# Patient Record
Sex: Male | Born: 1975 | Race: White | Hispanic: No | Marital: Married
Health system: Southern US, Community
[De-identification: ages and names within clinical notes are randomized; demographics above are authoritative.]

---

## 2000-01-26 ENCOUNTER — Ambulatory Visit (HOSPITAL_COMMUNITY): Admission: RE | Admit: 2000-01-26 | Discharge: 2000-01-26 | Payer: Self-pay | Admitting: Neurological Surgery

## 2000-01-26 ENCOUNTER — Encounter: Payer: Self-pay | Admitting: Neurological Surgery

## 2000-09-14 ENCOUNTER — Encounter: Payer: Self-pay | Admitting: Family Medicine

## 2000-09-14 ENCOUNTER — Ambulatory Visit (HOSPITAL_COMMUNITY): Admission: RE | Admit: 2000-09-14 | Discharge: 2000-09-14 | Payer: Self-pay | Admitting: Family Medicine

## 2000-10-26 ENCOUNTER — Encounter: Payer: Self-pay | Admitting: Emergency Medicine

## 2000-10-26 ENCOUNTER — Emergency Department (HOSPITAL_COMMUNITY): Admission: EM | Admit: 2000-10-26 | Discharge: 2000-10-26 | Payer: Self-pay | Admitting: Emergency Medicine

## 2001-02-19 ENCOUNTER — Emergency Department (HOSPITAL_COMMUNITY): Admission: EM | Admit: 2001-02-19 | Discharge: 2001-02-19 | Payer: Self-pay | Admitting: Emergency Medicine

## 2001-03-07 ENCOUNTER — Emergency Department (HOSPITAL_COMMUNITY): Admission: EM | Admit: 2001-03-07 | Discharge: 2001-03-07 | Payer: Self-pay | Admitting: Emergency Medicine

## 2001-03-09 ENCOUNTER — Emergency Department (HOSPITAL_COMMUNITY): Admission: EM | Admit: 2001-03-09 | Discharge: 2001-03-09 | Payer: Self-pay | Admitting: Emergency Medicine

## 2002-03-07 ENCOUNTER — Emergency Department (HOSPITAL_COMMUNITY): Admission: EM | Admit: 2002-03-07 | Discharge: 2002-03-07 | Payer: Self-pay | Admitting: Emergency Medicine

## 2002-04-04 ENCOUNTER — Emergency Department (HOSPITAL_COMMUNITY): Admission: EM | Admit: 2002-04-04 | Discharge: 2002-04-04 | Payer: Self-pay | Admitting: Emergency Medicine

## 2002-04-04 ENCOUNTER — Encounter: Payer: Self-pay | Admitting: Emergency Medicine

## 2004-12-06 ENCOUNTER — Emergency Department (HOSPITAL_COMMUNITY): Admission: EM | Admit: 2004-12-06 | Discharge: 2004-12-06 | Payer: Self-pay | Admitting: Emergency Medicine

## 2006-05-14 ENCOUNTER — Inpatient Hospital Stay (HOSPITAL_COMMUNITY): Admission: EM | Admit: 2006-05-14 | Discharge: 2006-05-18 | Payer: Self-pay | Admitting: Emergency Medicine

## 2006-05-14 ENCOUNTER — Ambulatory Visit: Payer: Self-pay | Admitting: Internal Medicine

## 2006-11-10 ENCOUNTER — Emergency Department (HOSPITAL_COMMUNITY): Admission: EM | Admit: 2006-11-10 | Discharge: 2006-11-10 | Payer: Self-pay | Admitting: Emergency Medicine

## 2006-11-25 ENCOUNTER — Emergency Department (HOSPITAL_COMMUNITY): Admission: EM | Admit: 2006-11-25 | Discharge: 2006-11-25 | Payer: Self-pay | Admitting: Emergency Medicine

## 2006-12-20 ENCOUNTER — Inpatient Hospital Stay (HOSPITAL_COMMUNITY): Admission: EM | Admit: 2006-12-20 | Discharge: 2006-12-23 | Payer: Self-pay | Admitting: Emergency Medicine

## 2007-01-07 ENCOUNTER — Ambulatory Visit (HOSPITAL_COMMUNITY): Admission: RE | Admit: 2007-01-07 | Discharge: 2007-01-07 | Payer: Self-pay | Admitting: Neurological Surgery

## 2008-05-29 ENCOUNTER — Emergency Department (HOSPITAL_COMMUNITY): Admission: EM | Admit: 2008-05-29 | Discharge: 2008-05-29 | Payer: Self-pay | Admitting: Emergency Medicine

## 2010-01-30 ENCOUNTER — Emergency Department (HOSPITAL_COMMUNITY): Admission: EM | Admit: 2010-01-30 | Discharge: 2010-01-31 | Payer: Self-pay | Admitting: Emergency Medicine

## 2010-02-07 ENCOUNTER — Ambulatory Visit (HOSPITAL_COMMUNITY): Admission: RE | Admit: 2010-02-07 | Discharge: 2010-02-07 | Payer: Self-pay | Admitting: Urology

## 2010-04-19 ENCOUNTER — Emergency Department (HOSPITAL_COMMUNITY): Admission: EM | Admit: 2010-04-19 | Discharge: 2010-04-19 | Payer: Self-pay | Admitting: Emergency Medicine

## 2011-02-17 LAB — URINALYSIS, ROUTINE W REFLEX MICROSCOPIC
Bilirubin Urine: NEGATIVE
Nitrite: NEGATIVE
Protein, ur: NEGATIVE mg/dL
pH: 5.5 (ref 5.0–8.0)

## 2011-02-17 LAB — POCT I-STAT, CHEM 8
BUN: 18 mg/dL (ref 6–23)
Chloride: 104 mEq/L (ref 96–112)
Glucose, Bld: 240 mg/dL — ABNORMAL HIGH (ref 70–99)
HCT: 43 % (ref 39.0–52.0)
Potassium: 4 mEq/L (ref 3.5–5.1)

## 2011-02-17 LAB — URINE MICROSCOPIC-ADD ON

## 2011-02-24 LAB — URINALYSIS, ROUTINE W REFLEX MICROSCOPIC
Bilirubin Urine: NEGATIVE
Leukocytes, UA: NEGATIVE
Nitrite: NEGATIVE
Specific Gravity, Urine: 1.031 — ABNORMAL HIGH (ref 1.005–1.030)
pH: 6 (ref 5.0–8.0)

## 2011-02-24 LAB — POCT I-STAT, CHEM 8
Hemoglobin: 13.9 g/dL (ref 13.0–17.0)
Sodium: 142 mEq/L (ref 135–145)
TCO2: 28 mmol/L (ref 0–100)

## 2011-02-24 LAB — URINE MICROSCOPIC-ADD ON

## 2011-04-18 NOTE — Discharge Summary (Signed)
Caleb Krueger, Caleb Krueger                 ACCOUNT NO.:  [id number]   MEDICAL RECORD NO.:  [id number]          PATIENT TYPE:  INP   LOCATION:  3010                         FACILITY:  [facility]   PHYSICIAN:  Stefani Dama, M.D.  DATE OF BIRTH:  Jun 19, 1976   DATE OF ADMISSION:  12/20/2006  DATE OF DISCHARGE:  12/23/2006                               DISCHARGE SUMMARY   ADMITTING DIAGNOSES:  1. Sinusitis  2. Herniated nucleus pulposus L3-L4 left.  3. Spondylosis L4-L5 left.  4. Left L5 radiculopathy.   DISCHARGE AND FINAL DIAGNOSES:  1. Sinusitis.  2. Sleep apnea  3. Herniated nucleus pulposus L3-4 left.  4. Spondylosis L4-5 left.  5. Left lumbar radiculopathy   CONDITION ON DISCHARGE:  Improving.   HOSPITAL COURSE:  Caleb Krueger is a 35 year old individual who had  difficulty with back pain and weakness in the left foot. He also in the  emergency room complained primarily of headache and stuffiness in his  head, but during the workup in the emergency department, it seemed that  the attention to weakness in the left lower extremity predominated, and  he was evaluated by Dr. Hilda Lias. An MRI was performed in the  emergency department which demonstrated presence of an extruded fragment  of disk at level of L3-L4 on the left.  It was also noted that he had  substantial spondylosis at L4-L5 on that left side with some lateral  recess stenosis affecting of the left L5 nerve root. He was started on  some Augmentin orally for the sinusitis and was given the option to be  admitted, and he was admitted to the hospital for pain control.   I had seen the patient on Tuesday morning on my return and noted that he  had the presence of a large disk herniation with marked foot drop on  that left side. He was taken to the operating room on the evening of  January 22 where he underwent diskectomy at L3-4, laminotomy and  foraminotomy L4-5.   Postoperatively, he has had good relief of the back and  leg pain.  His  incision is clean and reasonably dry at this time. He is ambulating  albeit with a foot drop. He will require some physical therapy during  the postoperative course, and this will be arranged on outpatient basis.  At the current time, he is given some local physical therapy.  He was  given a prescription for Percocet #60 without refills, Valium 5 mg #30  without refills.  He also given a 10-day course of Augmentin 875 mg  b.i.d.Marland Kitchen   The wife had voiced concern that the patient had some sleep apnea, and,  indeed, during the hospitalization was noted that in the evenings he  would desaturate spontaneously to the mid 80s.  The patient's  significant past medical history is that in the last 2 years he has had  kidney stones, a bout of pneumonia, chronic sinusitis, and, of course,  the back and leg problems.  He works as an [employer] manager, and I  discussed with him the implementation of  some  lifestyle changes and an effort to eat better, exercise more regularly,  and take stock of his overall health in an effort to improve his overall  conditioning. At the current time, he is discharged home, recovering  acutely from the surgery, and we will make arrangements for him to be  evaluated for sleep apnea on an outpatient basis.      Stefani Dama, M.D.  Electronically Signed     HJE/MEDQ  D:  12/23/2006  T:  12/23/2006  Job:  [id number]   cc:   Tammy R. Collins [place], M.D.

## 2011-04-18 NOTE — Op Note (Signed)
NAMELATHAM, KINZLER                 ACCOUNT NO.:  [id number]   MEDICAL RECORD NO.:  [id number]          PATIENT TYPE:  INP   LOCATION:  3010                         FACILITY:  [facility]   PHYSICIAN:  Stefani Dama, M.D.  DATE OF BIRTH:  10-09-1976   DATE OF PROCEDURE:  12/22/2006  DATE OF DISCHARGE:                               OPERATIVE REPORT   PREOPERATIVE DIAGNOSIS:  Herniated nucleus pulposus L3-4, left and  spondylosis L4-5, left with left lumbar radiculopathy, L5 distribution.   POSTOPERATIVE DIAGNOSIS:  Herniated nucleus pulposus L3-4, left and  spondylosis L4-5, left with left lumbar radiculopathy, L5 distribution.   PROCEDURES:  Microdiskectomy L3-4, left; laminotomy, foraminotomy L4-5,  left with operating microscope, microdissection technique.   SURGEON:  Stefani Dama, M.D.   FIRST ASSISTANT:  Dr. Coletta Memos.   ANESTHESIA:  General endotracheal.   INDICATIONS:  Caleb Krueger is a 35 year old individual who has had  significant problems with back and left lower extremity pain and now  weakness in the L5 distribution, having developed a foot drop.  He was  evaluated with an MRI on admission and found to have a large extruded  fragment of disk at L3-L4 on the left; however, the patient had a foot  drop consistent with an L5 radiculopathy and it was noted that he had  severe spondylitic stenosis in the lateral recess on the left side at L4-  5.  After careful consideration, he is advised regarding surgical  decompression at L3-4 and L4-5 on the left side.   PROCEDURE:  The patient was brought to the operating room supine on the  stretcher.  After smooth induction of general endotracheal anesthesia,  she was turned prone.  The back was shaved, prepped with alcohol and  DuraPrep and draped in a sterile fashion.  The lower portion of  previously made incision in this area was opened and dissection was  carried down to lumbodorsal fascia which was opened on the left side  of  midline.  The first interspace identified was L4-L5 on localizing  radiograph.  With the soft tissues being cleared in subperiosteal  fashion, laminotomy of L4 was undertaken by removing the inferior margin  lamina of L4 out to the medial wall of facette at the L4-L5 level.  The  thickened and redundant yellow ligament in this area was taken up with 2  and 3 mm Kerrison punch.  High-speed bur was used to do the laminotomy  and mesial facetectomy in this region.  The common dural tube was  ultimately exposed and then the dissection was taken down to identify  the takeoff of the L5 nerve root.  A foraminotomy was created over the  L5 nerve root where the mesial wall of facette was substantially  overgrown causing significant stenosis.  Once this area was  decompressed, attention was turned to L3-4 where laminotomy was created  in a similar fashion.  It should be noted that this portion of the  dissection including the laminotomy and foraminotomy was done under the  operating microscope with Dr. Franky Macho helping retract the nerve root  and  common dural tube while this procedure was being undertaken.  The L3-4  laminotomy and foraminotomy was undertaken in similar fashion and as the  area was opened and nerve root was exposed, it was noted be bowed  dorsally under significant mass by gently retracting the nerve root.  There was found to be fragments of disk material within the disk space.  This was removed in a piecemeal fashion, identifying fragments of disk  and taking them out sequentially.  Epidural bleeding occurred in this  area and this was controlled with bipolar cautery and some small  pledgets of Gelfoam soaked in thrombin.  Ultimately a portion of the  disk that extended cephalad to the disk space was identified and this  was withdrawn from by removing it from above.  The path of the L3 nerve  root above could then be palpated out in the foramen.  The L4 nerve root  itself was  decompressed by the diskectomy.  The disk space itself did  not appear to be violated and no gross opening into the disk space could  be identified.  With the decompression thus being completed, the common  dural tube and the L4 nerve root was well decompressed at the L3-4  space.  L4-5 was again inspected and found to be decompressed with the  L5 nerve root exiting freely.  Area was copiously irrigated with  antibiotic irrigating solution.  Hemostasis in the soft tissues was  obtained and 1 mL of fentanyl was left in the epidural space and the  lumbodorsal fascia was closed with #1 Vicryl in interrupted fashion, 2-0  Vicryl subcutaneous tissues, 3-0 Vicryl subcuticularly.  Then dry  sterile dressing placed on the skin.  The patient tolerated procedure  well, was returned to recovery in stable condition.   BLOOD LOSS:  150 mL.      Stefani Dama, M.D.  Electronically Signed     HJE/MEDQ  D:  12/22/2006  T:  12/23/2006  Job:  [id number]

## 2011-04-18 NOTE — Discharge Summary (Signed)
Caleb Krueger, Caleb Krueger NO.:  [id number]   MEDICAL RECORD NO.:  [id number]          PATIENT TYPE:  INP   LOCATION:  3008                         FACILITY:  [facility]   PHYSICIAN:  Duncan Dull, M.D.     DATE OF BIRTH:  1976-07-17   DATE OF ADMISSION:  05/14/2006  DATE OF DISCHARGE:  05/18/2006                                 DISCHARGE SUMMARY   DISCHARGE DIAGNOSES:  1.  Community acquired pneumonia.  2.  Steroid induced hypoglycemia and leukocytosis.  3.  Ongoing tobacco abuse.   DISCHARGE MEDICATIONS:  1.  Avelox 400 mg p.o. daily until 05/21/2006.  2.  Prednisone on taper, last dose to be taken on 05/25/2006.  3.  Spiriva 18 mcg to be inhaled once daily.  4.  Albuterol MDI 1-2 puffs q. 4-6 hours p.r.n.  5.  Tylenol 650 mg q. 4-6 hours p.r.n. for pain.  6.  Mucinex 600 mg p.o. b.i.d.  7.  Tussionex suspension 5 mL at bedtime.   FOLLOWUP:  The patient is to follow up with his primary care physician, Dr.  Yehuda Budd within one week of discharge.  At that time, a basic metabolic panel  along with a CBC must be checked as the patient has steroid induced  hypoglycemia and a leukocytosis.   Images done this admission:  1.  Chest x-ray done on 05/14/2006 showing bronchitic changes.  No focal      infiltrates.  2.  CT angio of chest done on 05/15/2006 negative for PE with abnormal      ground-glass opacities in perihilar lesions suggestive of atypical      pneumonia, bronchiectasis obliterans, or alveolitis.   CONSULTANTS:  None.   PROCEDURES:  None.   BRIEF HISTORY AND PHYSICAL:  Caleb Krueger is a 36 year old white man with a  history of complicated orthopedic procedure for congenital spine disease  presenting to the emergency department with complaints of chest pain  accompanied by dyspnea.  His symptoms began four days prior to admission  after a prolonged drive from [place] and was described as central in  location, constant, pressure-type, which increased with  coughing.  Patient  had been was treated by by his primary care physician prior to admission  with  Avelox for presumed atypical pneumonia.   ALLERGIES:  NO KNOWN DRUG ALLERGIES.   MEDICATIONS:  1.  Avelox 400 mg p.o. daily.  2.  Tylenol/pain meds p.r.n.  3.  Viagra.  4.  Albuterol p.r.n.  5.  Duramax (decongestant).   SUBSTANCE HISTORY:  See H & P.   SOCIAL HISTORY:  See H & P.   FAMILY HISTORY:  See H & P.   PHYSICAL EXAMINATION:  Temperature 96.5, blood pressure 130/84, pulse 77,  respiratory rate 20, O2 sat 90% on room air.  (Patient's O2 sats apparently  were 88% in Dr. Alda Berthold office.)  GENERAL APPEARANCE:  The patient appeared uncomfortable with labored  breathing.  EYES:  Pupils equal, round, reactive to light.  Extraocular movements  intact.  ENT:  Oropharynx clear.  No erythema or exudate.  NECK:  Supple with no adenopathy.  LUNGS:  Air entry equal bilaterally.  Inspiratory wheezes.  HEART:  Regular rate and rhythm.  No murmurs, rubs, or gallops.  ABDOMEN:  Soft, nondistended.  Tenderness present in the right upper  quadrant and epigastric region.  Bowel sounds present.  EXTREMITIES:  Edema negative.  No calf tenderness.  NEUROLOGIC:  Alert and oriented x3.  Grossly nonfocal.  PSYCHIATRIC:  Appropriate.   LABORATORY DATA:  On examination, sodium 140, potassium 3.9, chloride 108,  bicarb 24, BUN 17, creatinine 0.9, glucose 89.  Total bilirubin of 0.5, alk  phos of 89, SGOT 27, SGPT 29, total protein 7.6, albumin 4.3, calcium 9.  Hemoglobin 16.6, hematocrit 48.5, white cell 9.2, ANC 5.8, and platelets of  194.   BRIEF HOSPITAL COURSE:  1.  Community acquired pneumonia versus atypical pneumonia versus      bronchitis.  The patient was treated with ceftriaxone and azithromycin      intravenously.  Blood and sputum cultures for bacterial pathogens were      negative x2.  Urine for Legionella antigen was negative.  HIV test was      nonreactive.  Sputum cultures for  fungus were collected due to recent      travel to [place] and were pending on discharge.  Patient was      transitioned to oral antiobiotics with continued improvement.  After      The patient was continued on bronchodilator therapy, Tylenol p.r.n. for      chest pain, and antitussive medications at night.  The patient needs to      follow up with his primary care physician within one week of discharge      from the hospital.  Chest x-ray must be done six weeks after discharge      in order to check for resolution of the pneumonia.  If patient's      symptoms continue to persist, antifungals should be considered      especially since the patient has extensive travel history in the last 2-      3 weeks.  2.  Steroid induced leukocytosis and hypoglycemia.  The patient was treated      with methylprednisolone for wheezing.  As steroids were tapered, his      leukocytosis and hypoglycemia improved.  This needs to be monitored as      an outpatient.  Therefore, a stat basic metabolic panel and complete      blood count must be done at his primary care physician's office.  3.  Ongoing tobacco abuse.  Tobacco cessation consult was called and patient      reports planning to quit on his own.   DISCHARGE VITALS:  Temperature 96.8, blood pressure 112/60, pulse 67,  respiratory rate of 18, and O2 sat of 93% on room air.  On ambulation the  patient's O2 sats were 92-95%.   LABORATORY DATA:  On discharge, sodium 139, potassium 3.8, chloride 102,  bicarb 31, BUN 7, creatinine 0.9, glucose of 181.  Hemoglobin of 12,  hematocrit 35.2, white cell 5.3, and platelets of 293.   Of note, the patient performed peak flow three times and achieved a result  of 450.      Yetta Barre, M.D.  Electronically Signed      Duncan Dull, M.D.  Electronically Signed    SS/MEDQ  D:  05/25/2006  T:  05/25/2006  Job:  [id number]

## 2011-04-18 NOTE — H&P (Signed)
Caleb Krueger, OOMMEN NO.:  [id number]   MEDICAL RECORD NO.:  [id number]          PATIENT TYPE:  INP   LOCATION:  1832                         FACILITY:  [facility]   PHYSICIAN:  Hilda Lias, M.D.   DATE OF BIRTH:  Jun 05, 1976   DATE OF ADMISSION:  12/20/2006  DATE OF DISCHARGE:                              HISTORY & PHYSICAL   HISTORY OF PRESENT ILLNESS:  [name] is a gentleman who came to the  emergency room complaining of weakness of the left foot for almost 7  days.  This happened many years ago and he underwent lumbar fusion at  [facility] and 10 years ago he was seen by my partner Dr. Danielle Dess in the  office because of back pain, he was told he had degenerative disc  disease.  He has not done any surgery on him.  Today, he came to the  emergency room with a history of 6 days of weakness of the left foot  with pain.  He told me that the pain is really unbearable, although the  weakness is not getting any worse, although he feels that it is getting  a little bit better for the past few days.  He denies any problems with  bladder or bowels.  He denies any problem with the right leg.   PAST MEDICAL HISTORY:  1. Lumbar fusion x2, at [facility].  2. Surgery of the right foot.   ALLERGIES:  HE IS NOT ALLERGIC TO ANY MEDICATION.   SOCIAL HISTORY:  He does not smoke.   FAMILY HISTORY:  Unremarkable.   REVIEW OF SYSTEMS:  Positive for back pain.   PHYSICAL EXAMINATION:  GENERAL:  Patient was laying on the stretcher  complaining of pain down the left leg.  HEAD:  Normal.  NECK:  Normal.  LUNGS:  Clear.  HEART:  Heart sounds normal.  ABDOMEN:  Normal.  EXTREMITIES:  Normal pulse.  He has a scar on the right foot from  previous surgery.  NEURO:  Mental status normal.  Cranial nerves normal.  His strength is  5/5 except in the left foot.  He has 4/5 right plantar flexion and 2/5  dorsiflexion of the left foot.  He has numbness of the left foot,  __________  is positive at 45 degrees on the left side.   The MRI showed the previous surgery with metal in the area, most likely  Harrington rods.  There is a herniated disc of the level of L5-S1  affecting the S1 nerve root.   IMPRESSION:  Left L5-S1 herniated disc with a 2/5 weakness dorsiflexion  of the left foot.   RECOMMENDATIONS:  I talked to him and his wife at length.  I gave them 2  choices.  One would be to go home with medication including Cortisone by  mouth and to follow up in the next 2 or 3 day to see how he does.  The  reason for that is because according to himself, he is getting a little  better in relation to the weakness.  The other choice was  to be admitted  to the hospital.  Patient and his wife feel that he had so pain that  they prefer for him to be admitted to the hospital for better control.  We are going to admit him to the unit 3000.  He is going get p.o.  Decadron, morphine intravenous, and see how he does in the next 24-48  hours.  If he is not better, I am sure Dr. __________  will be taking  him to surgery.           ______________________________  Hilda Lias, M.D.     EB/MEDQ  D:  12/20/2006  T:  12/20/2006  Job:  [id number]

## 2011-08-28 LAB — DIFFERENTIAL
Basophils Relative: 0
Lymphs Abs: 2.1
Monocytes Relative: 15 — ABNORMAL HIGH
Neutro Abs: 4
Neutrophils Relative %: 54

## 2011-08-28 LAB — CBC
RBC: 4.34
WBC: 7.5

## 2011-08-28 LAB — POCT I-STAT, CHEM 8
BUN: 22
Creatinine, Ser: 1.1
Hemoglobin: 13.9
Potassium: 4
Sodium: 141

## 2012-01-29 IMAGING — CT CT ABD-PELV W/O CM
3 series · 11 of 23 positions shown, 18 images · non-contrast
Comparison: None.

CLINICAL DATA: Bilateral flank and low back pain, worse on the
left.  History urinary tract stones.

CT ABDOMEN AND PELVIS WITHOUT CONTRAST
TECHNIQUE: Multidetector CT imaging of the abdomen and pelvis was
performed following the standard protocol without intravenous
contrast.

[Series 6: lung windows · axial · 0.83mm/px · z∈[+956,+986]mm · 7 of 9 slices shown, 12 images]
[im 2/9  soft-tissue]
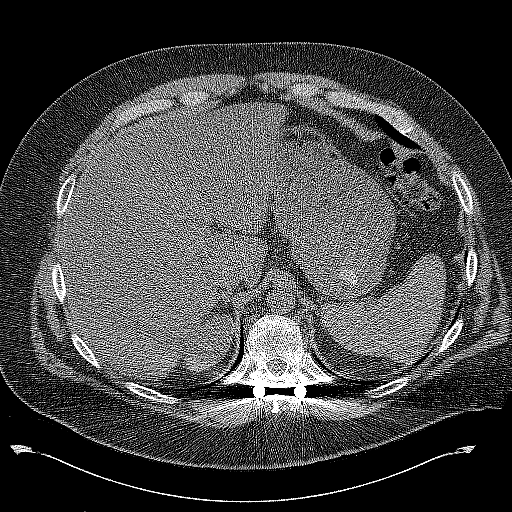
[im 2/9  bone]
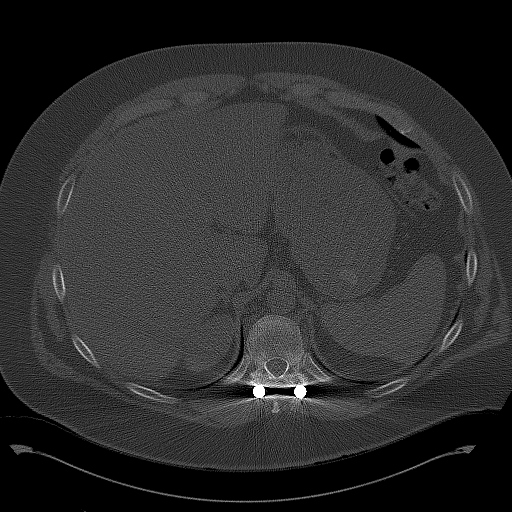
[im 3/9  soft-tissue]
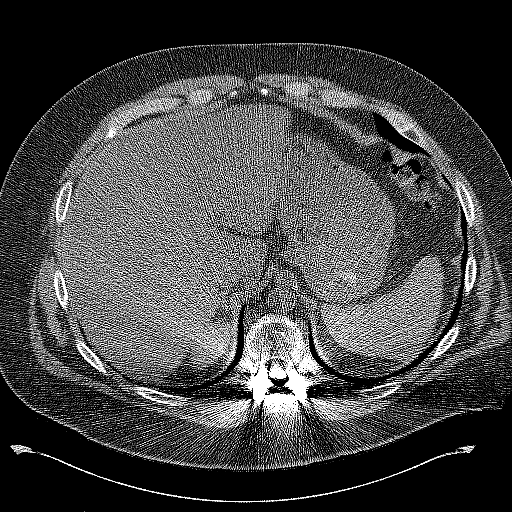
[im 4/9  soft-tissue]
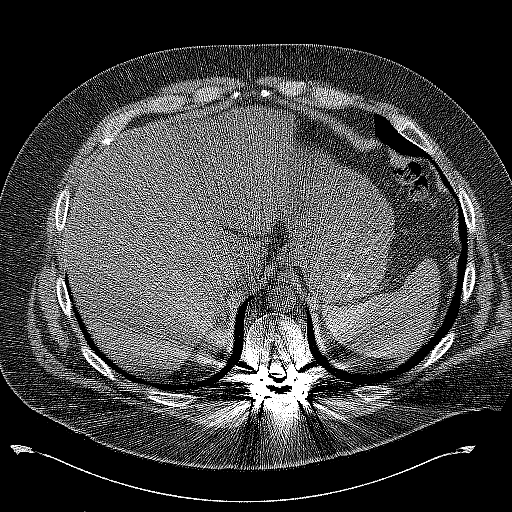
[im 5/9  soft-tissue]
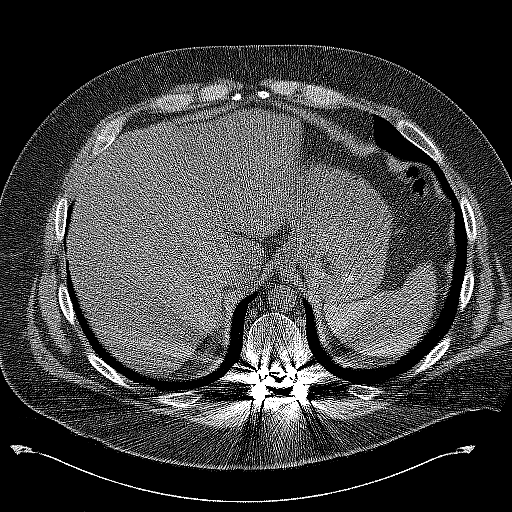
[im 5/9  lung]
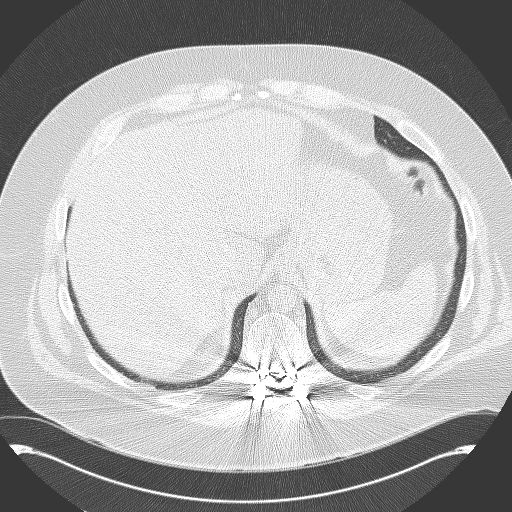
[im 6/9  soft-tissue]
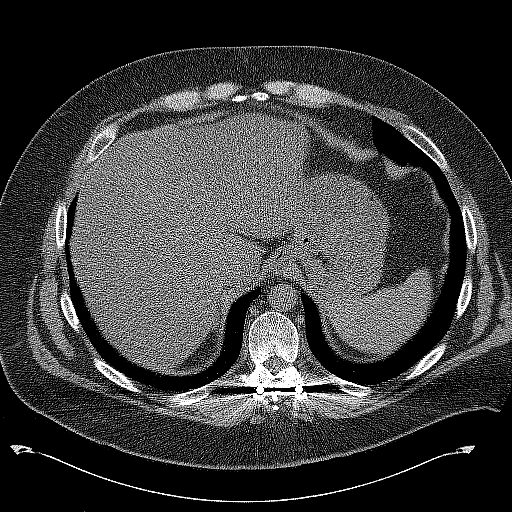
[im 6/9  lung]
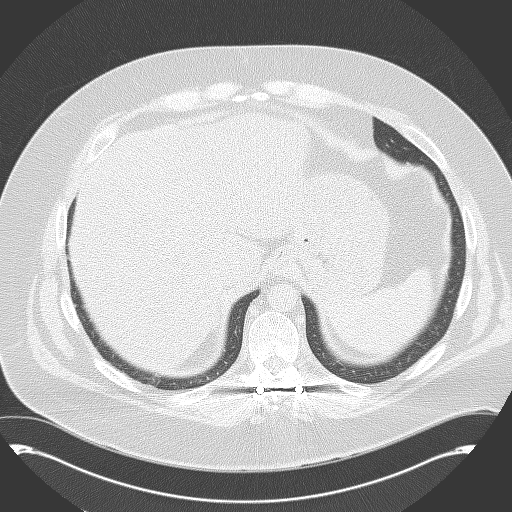
[im 7/9  soft-tissue]
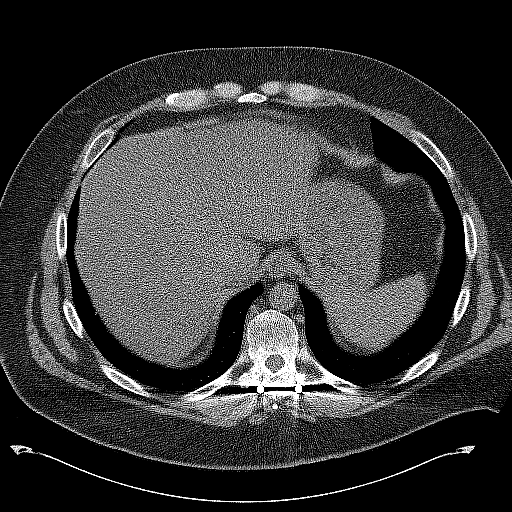
[im 7/9  lung]
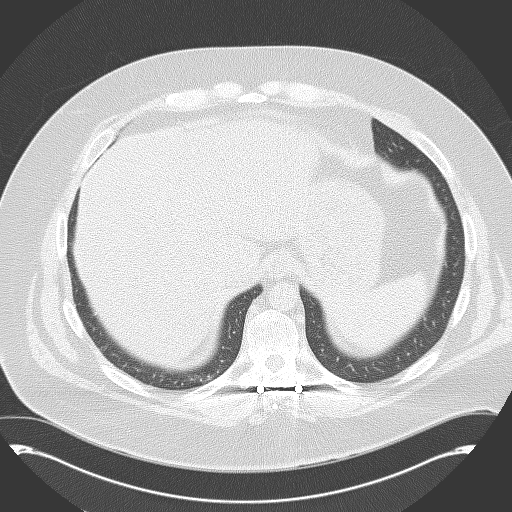
[im 8/9  soft-tissue]
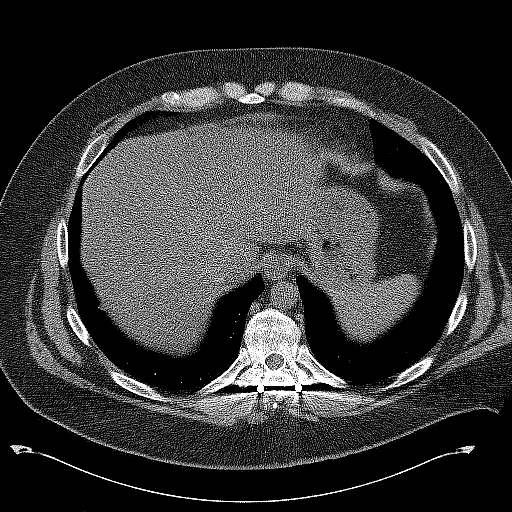
[im 8/9  lung]
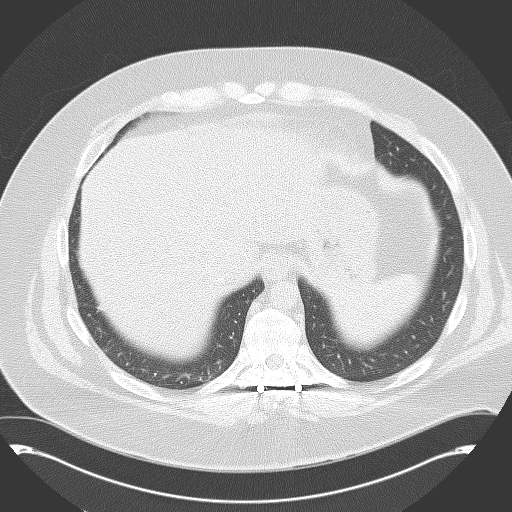

[Series 602: <mpr thick range> · coronal · 0.83mm/px · 3 of 92 slices shown, 4 images]
[im 31/92  soft-tissue]
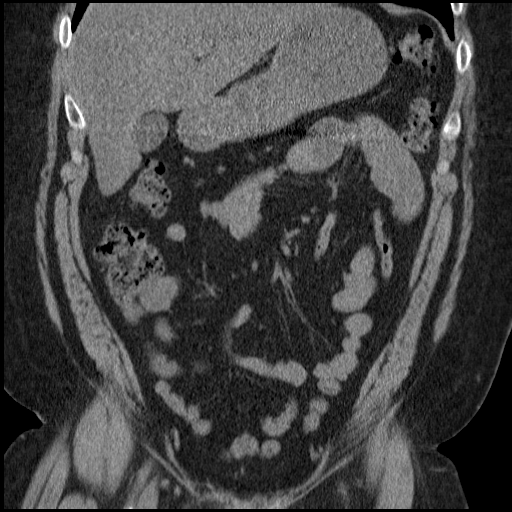
[im 41/92  soft-tissue]
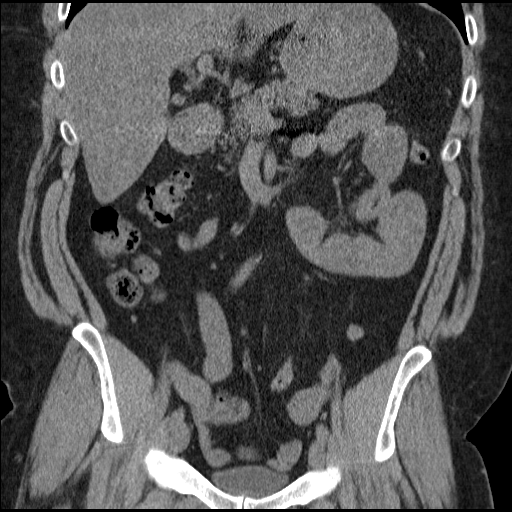
[im 41/92  bone]
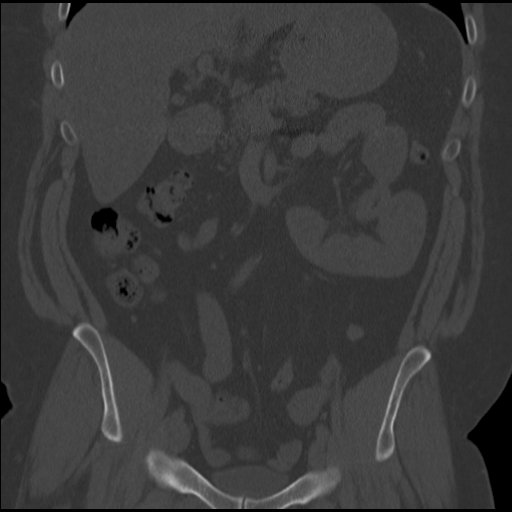
[im 51/92  soft-tissue]
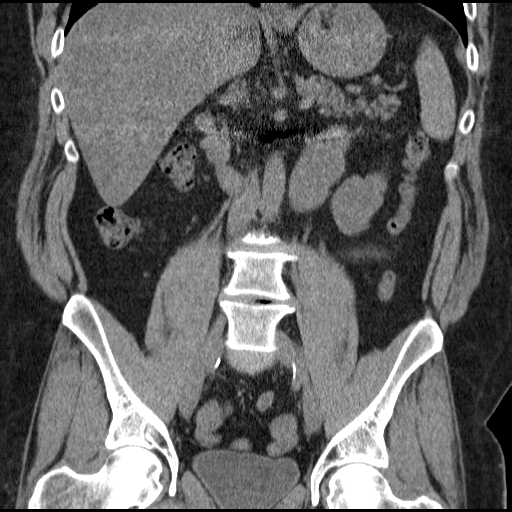

[Series 603: <mpr thick range(1)> · sagittal · 0.83mm/px · 1 of 123 slices shown, 2 images]
[im 41/123  soft-tissue]
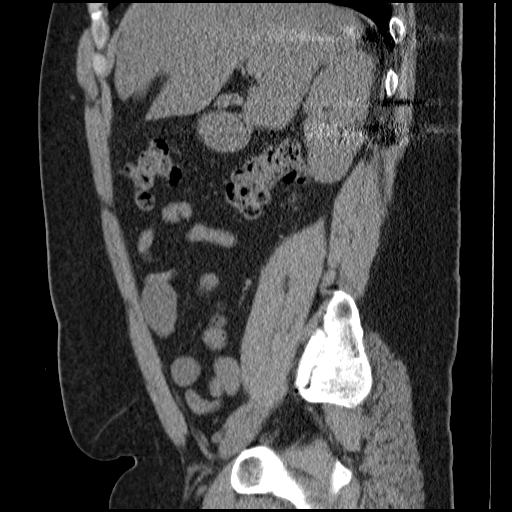
[im 41/123  bone]
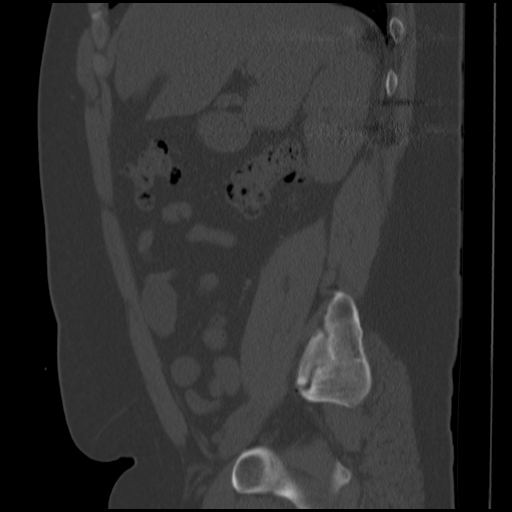

[11 of 23 positions shown; findings below may reference images not displayed]

FINDINGS: Imaged lung parenchyma is clear.  No pleural effusion is
identified.

The patient has a large stone in the left renal pelvis which
measures 1.2 cm in diameter.  There is mild left hydronephrosis.
No other urinary tract stones on the right or left.  The kidneys
otherwise have a normal uninfused appearance.

The liver is diffusely low attenuating consistent with fatty
change.  No focal liver lesion.  The gallbladder, adrenal glands,
spleen and visualized pancreas appear normal.  No lymphadenopathy
or fluid.  The stomach and small large bowel and appendix all
appear normal.  There is no focal bony abnormality.  There is
partial visualization of a spinal fusion hardware extending above
the superior margin of the film.
IMPRESSION: 1.  Large stone in the left renal pelvis with resultant mild left
hydronephrosis.
2.  Fatty infiltration of the liver.

## 2012-04-16 IMAGING — CR DG ABDOMEN 1V
3 series · 3 of 3 positions shown · non-contrast
Comparison: One-view abdomen x-ray 02/07/2010 and CT abdomen and
pelvis 01/31/2010.  AP pelvis x-ray 12/06/2004.

CLINICAL DATA: Low back pain.  History of urinary tract calculi.

ABDOMEN - 1 VIEW 04/19/2010:

[t abdomen supine (1 of 3)]
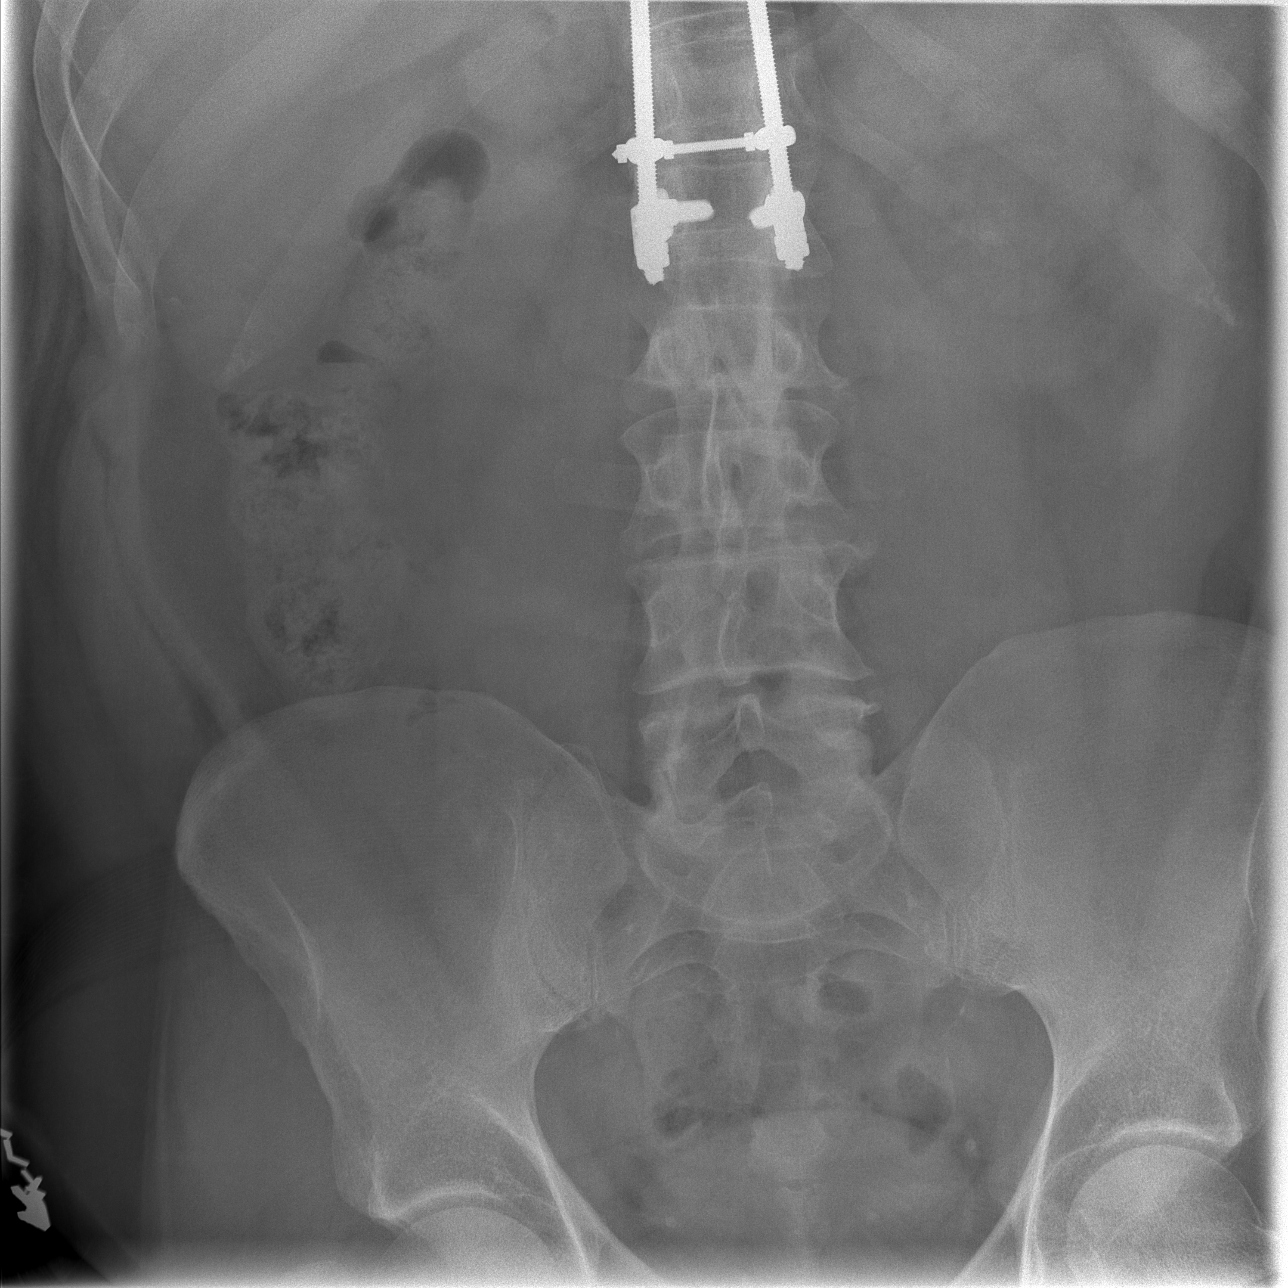

[t abdomen supine (2 of 3)]
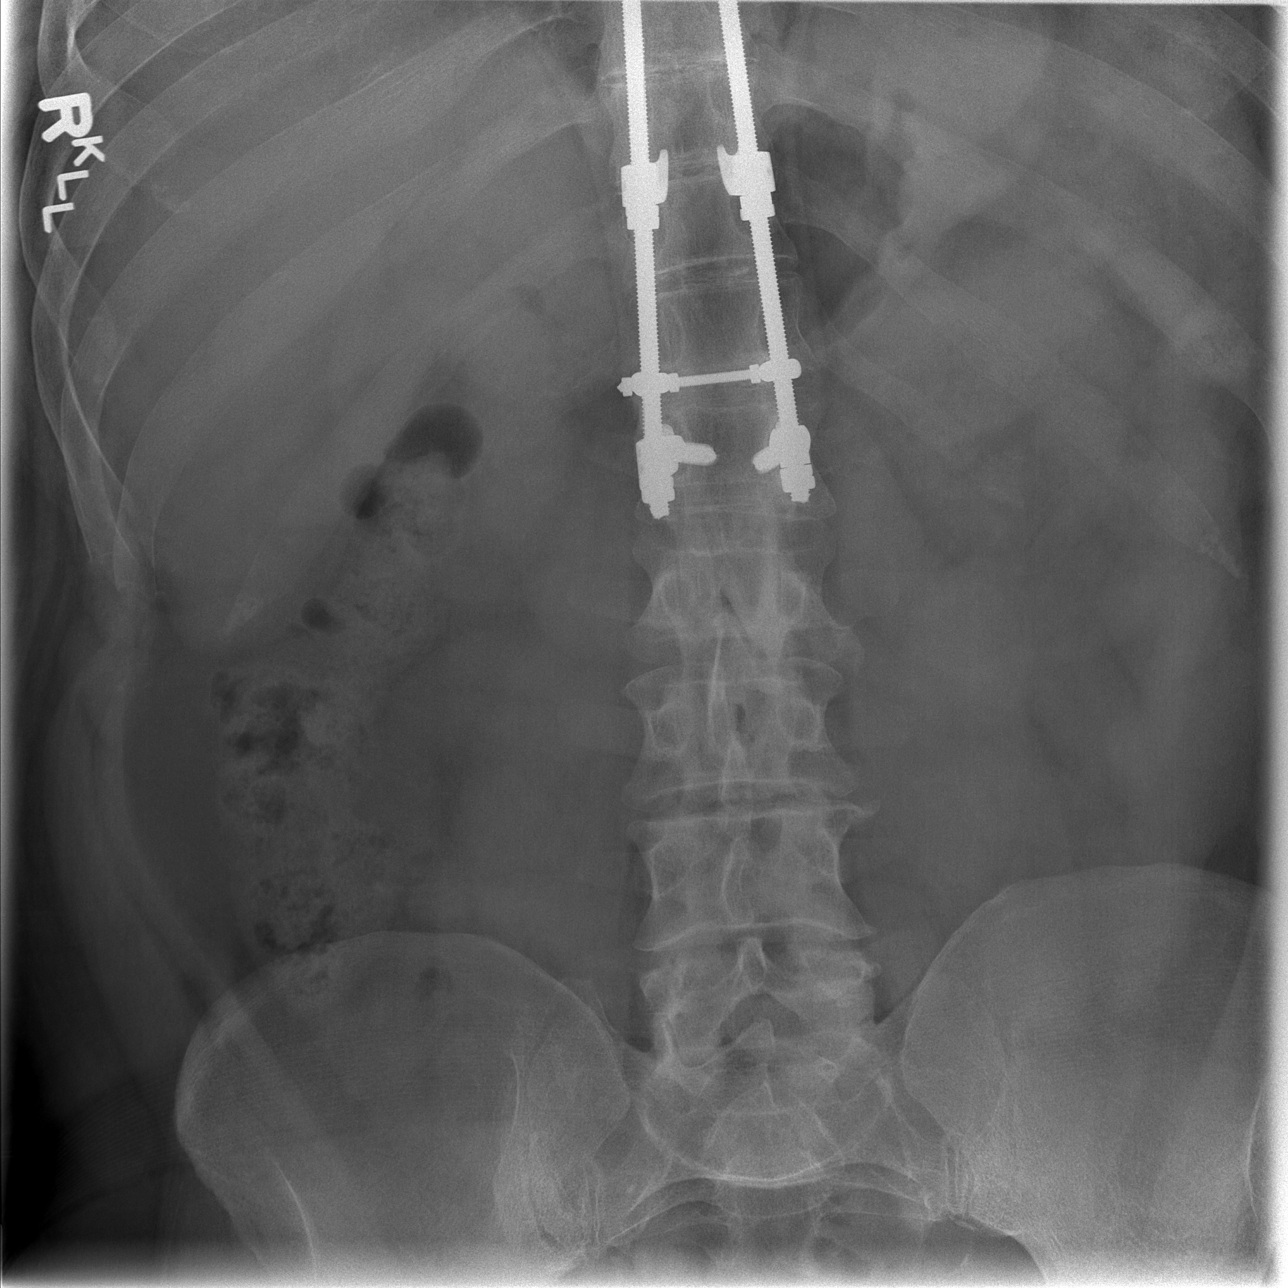

[t abdomen supine (3 of 3)]
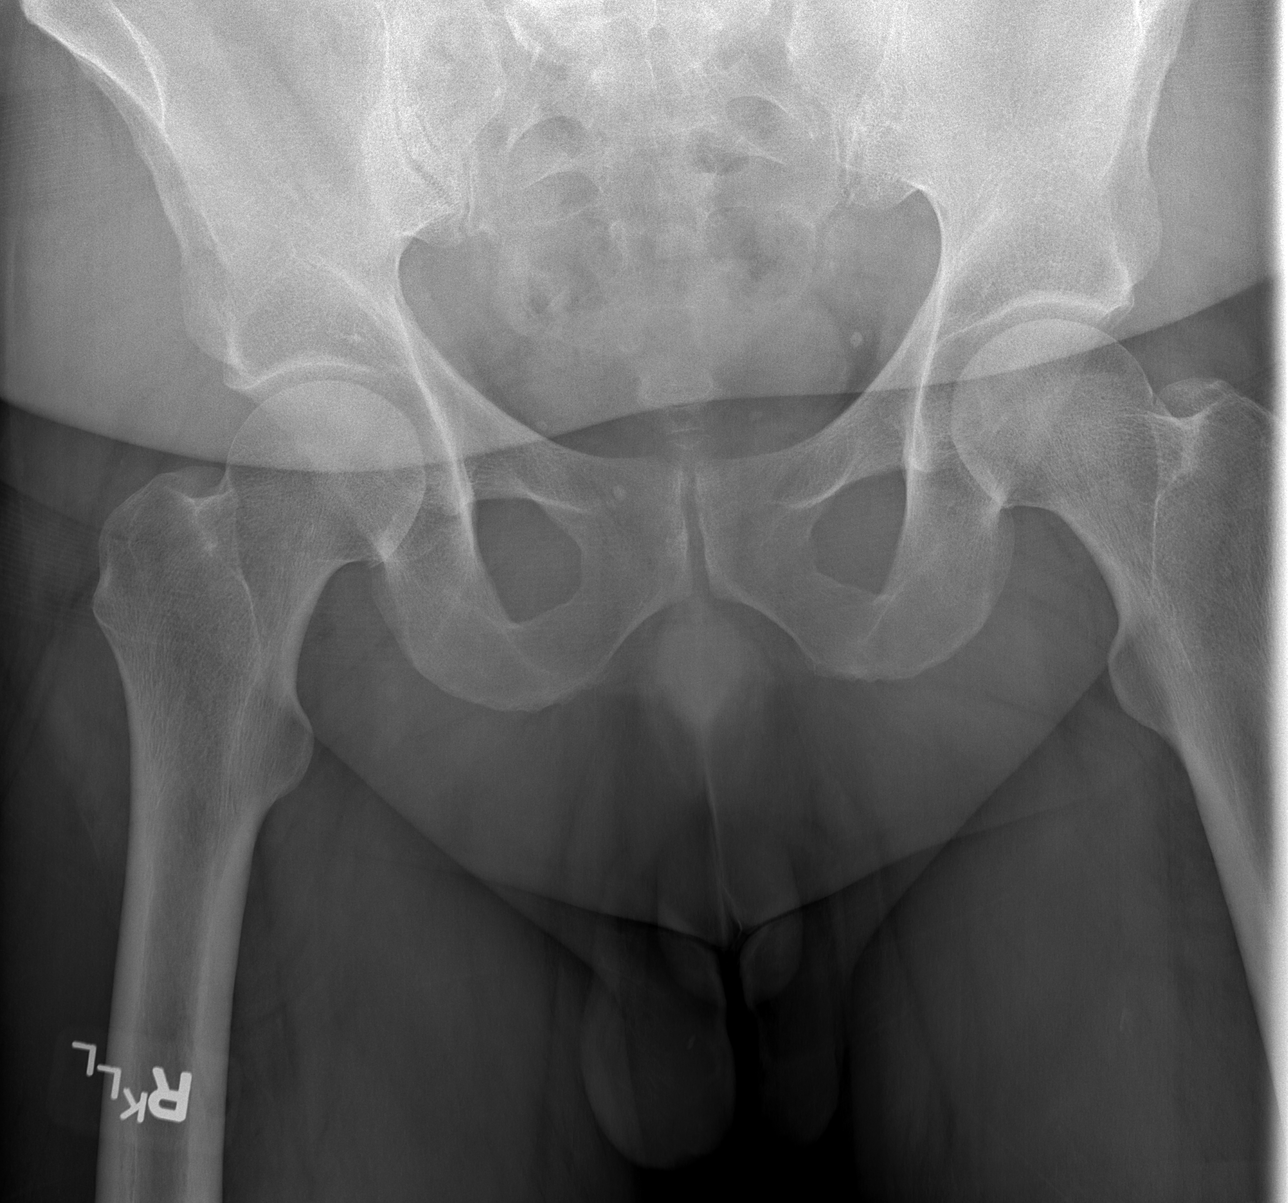

[3 of 3 positions shown; findings below may reference images not displayed]

FINDINGS: Previously identified left UPJ calculus no longer visible
on the current examination.  No opaque upper urinary tract calculi
on either side.  Phleboliths in both sides of the pelvis; new
calcification in the left side of the pelvis near the expected
location of the left UVJ.  Bowel gas pattern unremarkable without
evidence of obstruction or significant ileus.  Harrington rods
within the thoracolumbar spine intact in its visualized portion.
Degenerative changes involving the lower lumbar spine.
IMPRESSION: Possible small left UVJ calculus.

## 2016-05-20 ENCOUNTER — Telehealth: Payer: Self-pay

## 2016-05-20 NOTE — Telephone Encounter (Signed)
LM for patient to call back and reschedule appt on Monday. Dr. Frances FurbishAthar is ooo.

## 2016-05-26 ENCOUNTER — Institutional Professional Consult (permissible substitution): Payer: Self-pay | Admitting: Neurology

## 2018-09-08 DIAGNOSIS — Z801 Family history of malignant neoplasm of trachea, bronchus and lung: Secondary | ICD-10-CM

## 2018-09-08 DIAGNOSIS — D72819 Decreased white blood cell count, unspecified: Secondary | ICD-10-CM

## 2018-09-08 DIAGNOSIS — R760 Raised antibody titer: Secondary | ICD-10-CM

## 2018-09-08 DIAGNOSIS — Z800 Family history of malignant neoplasm of digestive organs: Secondary | ICD-10-CM

## 2018-09-08 DIAGNOSIS — D696 Thrombocytopenia, unspecified: Secondary | ICD-10-CM

## 2018-09-08 DIAGNOSIS — Z803 Family history of malignant neoplasm of breast: Secondary | ICD-10-CM | POA: Diagnosis not present

## 2018-09-08 DIAGNOSIS — Z808 Family history of malignant neoplasm of other organs or systems: Secondary | ICD-10-CM

## 2018-09-20 DIAGNOSIS — R5382 Chronic fatigue, unspecified: Secondary | ICD-10-CM | POA: Diagnosis not present

## 2018-09-20 DIAGNOSIS — Z808 Family history of malignant neoplasm of other organs or systems: Secondary | ICD-10-CM

## 2018-09-20 DIAGNOSIS — Z800 Family history of malignant neoplasm of digestive organs: Secondary | ICD-10-CM

## 2018-09-20 DIAGNOSIS — Z803 Family history of malignant neoplasm of breast: Secondary | ICD-10-CM

## 2018-09-20 DIAGNOSIS — D696 Thrombocytopenia, unspecified: Secondary | ICD-10-CM

## 2018-09-20 DIAGNOSIS — Z801 Family history of malignant neoplasm of trachea, bronchus and lung: Secondary | ICD-10-CM

## 2021-09-30 ENCOUNTER — Emergency Department (HOSPITAL_BASED_OUTPATIENT_CLINIC_OR_DEPARTMENT_OTHER)
Admission: EM | Admit: 2021-09-30 | Discharge: 2021-09-30 | Discharge status: Against Medical Advice | Payer: BLUE CROSS/BLUE SHIELD

## 2021-09-30 NOTE — ED Triage Notes (Incomplete)
Pt c/o back pain increasing x several days, reports was sent to ER for

## 2021-09-30 NOTE — ED Notes (Signed)
While in triage, pt reports he wishes to leave; discussed benefits and risks of staying v leaving, pt continues to request to leave

## 2022-03-12 DIAGNOSIS — D72819 Decreased white blood cell count, unspecified: Secondary | ICD-10-CM | POA: Insufficient documentation

## 2022-06-04 ENCOUNTER — Ambulatory Visit: Payer: Self-pay | Admitting: Oncology

## 2022-06-04 ENCOUNTER — Other Ambulatory Visit: Payer: Self-pay

## 2022-06-04 ENCOUNTER — Other Ambulatory Visit: Payer: Self-pay | Admitting: Oncology

## 2022-06-04 DIAGNOSIS — D696 Thrombocytopenia, unspecified: Secondary | ICD-10-CM

## 2022-06-04 DIAGNOSIS — D72819 Decreased white blood cell count, unspecified: Secondary | ICD-10-CM

## 2022-06-09 ENCOUNTER — Telehealth: Payer: Self-pay | Admitting: Oncology

## 2022-06-09 NOTE — Telephone Encounter (Signed)
R/s pt's appt with Dr. Gilman Buttner. Pt is aware of new appt date/time.

## 2022-06-20 ENCOUNTER — Inpatient Hospital Stay: Payer: Self-pay

## 2022-06-20 ENCOUNTER — Ambulatory Visit: Payer: Self-pay | Admitting: Oncology
